# Patient Record
Sex: Male | Born: 2000
Health system: Southern US, Community
[De-identification: ages and names within clinical notes are randomized; demographics above are authoritative.]

---
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
## 2019-09-25 ENCOUNTER — Emergency Department (HOSPITAL_BASED_OUTPATIENT_CLINIC_OR_DEPARTMENT_OTHER): Payer: Self-pay

## 2019-09-25 ENCOUNTER — Emergency Department (HOSPITAL_BASED_OUTPATIENT_CLINIC_OR_DEPARTMENT_OTHER)
Admission: EM | Admit: 2019-09-25 | Discharge: 2019-09-25 | Disposition: A | Discharge status: Home / Self Care | Payer: Self-pay | Attending: Emergency Medicine | Admitting: Emergency Medicine

## 2019-09-25 ENCOUNTER — Other Ambulatory Visit: Payer: Self-pay

## 2019-09-25 ENCOUNTER — Encounter (HOSPITAL_BASED_OUTPATIENT_CLINIC_OR_DEPARTMENT_OTHER): Payer: Self-pay | Admitting: *Deleted

## 2019-09-25 DIAGNOSIS — Y9375 Activity, martial arts: Secondary | ICD-10-CM | POA: Insufficient documentation

## 2019-09-25 DIAGNOSIS — S9032XA Contusion of left foot, initial encounter: Secondary | ICD-10-CM | POA: Insufficient documentation

## 2019-09-25 DIAGNOSIS — T1490XA Injury, unspecified, initial encounter: Secondary | ICD-10-CM

## 2019-09-25 DIAGNOSIS — Y929 Unspecified place or not applicable: Secondary | ICD-10-CM | POA: Insufficient documentation

## 2019-09-25 DIAGNOSIS — Y999 Unspecified external cause status: Secondary | ICD-10-CM | POA: Insufficient documentation

## 2019-09-25 DIAGNOSIS — X509XXA Other and unspecified overexertion or strenuous movements or postures, initial encounter: Secondary | ICD-10-CM | POA: Insufficient documentation

## 2019-09-25 MED ORDER — IBUPROFEN 800 MG PO TABS
800.0000 mg | ORAL_TABLET | Freq: Once | ORAL | Status: AC
  Administered 2019-09-25: 800 mg via ORAL
  Filled 2019-09-25: qty 1

## 2019-09-25 NOTE — ED Provider Notes (Signed)
[facility] Provider Note   CSN: [id number] Arrival date & time: 09/25/19  0003     History   Chief Complaint Chief Complaint  Patient presents with  . Foot Injury    HPI Alan Logan is a 18 y.o. male.     HPI  This is a 18 year old male who presents with left foot pain.  Patient reports that he was kickboxing yesterday when he came down on his left foot.  Since that time he has had pain that is worse with ambulation.  Currently he rates his pain at 2 out of 10 but it increases with ambulation.  It is mostly just proximal to his great toe.  He denies any other injury.  He denies ankle pain or knee pain.  History reviewed. No pertinent past medical history.  There are no active problems to display for this patient.   History reviewed. No pertinent surgical history.      Home Medications    Prior to Admission medications   Not on File    Family History No family history on file.  Social History Social History   Tobacco Use  . Smoking status: Never Smoker  . Smokeless tobacco: Never Used  Substance Use Topics  . Alcohol use: Never    Frequency: Never  . Drug use: Never     Allergies   Patient has no known allergies.   Review of Systems Review of Systems  Musculoskeletal:       Foot pain  Neurological: Negative for weakness and numbness.  All other systems reviewed and are negative.    Physical Exam Updated Vital Signs BP (!) 142/71 (BP Location: Left Arm)   Pulse 60   Temp 98.7 F (37.1 C) (Oral)   Resp 18   Ht 1.803 m (5\' 11" )   Wt 72.6 kg   SpO2 100%   BMI 22.32 kg/m   Physical Exam Vitals signs and nursing note reviewed.  Constitutional:      Appearance: He is well-developed.  HENT:     Head: Normocephalic and atraumatic.     Mouth/Throat:     Mouth: Mucous membranes are moist.  Cardiovascular:     Rate and Rhythm: Normal rate and regular rhythm.  Pulmonary:     Effort: Pulmonary effort is  normal. No respiratory distress.  Musculoskeletal:     Comments: Normal range of motion of the ankle and toes of the left foot, no overlying skin changes, contusions, no obvious deformity, tenderness to palpation just proximal to the first MTP, no significant midfoot tenderness, 2+ DP pulse  Skin:    General: Skin is warm and dry.  Neurological:     Mental Status: He is alert and oriented to person, place, and time.  Psychiatric:        Mood and Affect: Mood normal.      ED Treatments / Results  Labs (all labs ordered are listed, but only abnormal results are displayed) Labs Reviewed - No data to display  EKG None  Radiology Dg Foot 2 Views Left  Result Date: 09/25/2019 CLINICAL DATA:  Left foot injury EXAM: LEFT FOOT - 2 VIEW COMPARISON:  None. FINDINGS: There is no evidence of fracture or dislocation. There is no evidence of arthropathy or other focal bone abnormality. Soft tissues are unremarkable. IMPRESSION: Negative. Electronically Signed   By: Rolm Baptise M.D.   On: 09/25/2019 03:25   Dg Foot Complete Left  Result Date: 09/25/2019 CLINICAL DATA:  Left foot pain after injury during kick boxing yesterday. Pain about the first and second metatarsals. EXAM: LEFT FOOT - COMPLETE 3+ VIEW COMPARISON:  None. FINDINGS: There is no evidence of fracture or dislocation. There is no evidence of arthropathy or other focal bone abnormality. Soft tissues are unremarkable. IMPRESSION: Negative radiographs of the left foot. Should symptoms persist, consider follow-up weight-bearing views. Electronically Signed   By: Narda Rutherford M.D.   On: 09/25/2019 00:48    Procedures Procedures (including critical care time)  Medications Ordered in ED Medications  ibuprofen (ADVIL) tablet 800 mg (800 mg Oral Given 09/25/19 0229)     Initial Impression / Assessment and Plan / ED Course  I have reviewed the triage vital signs and the nursing notes.  Pertinent labs & imaging results that were  available during my care of the patient were reviewed by me and considered in my medical decision making (see chart for details).        Patient presents with left foot pain.  Overall nontoxic and vital signs are reassuring.  He has some tenderness to palpation but no obvious deformity on exam.  Neurovascularly intact.  Initial x-rays negative.  Additional weightbearing x-rays are negative for any Lisfranc injury or other injury.  Recommend ice, rest, anti-inflammatories.  Suspect contusion.  After history, exam, and medical workup I feel the patient has been appropriately medically screened and is safe for discharge home. Pertinent diagnoses were discussed with the patient. Patient was given return precautions.   Final Clinical Impressions(s) / ED Diagnoses   Final diagnoses:  Contusion of left foot, initial encounter    ED Discharge Orders    None       Horton, Mayer Masker, MD 09/25/19 [phone]

## 2019-09-25 NOTE — ED Triage Notes (Signed)
Pt reports he injured his left foot in a kick boxing event yesterday. Reports he came down on it wrong. Ambulated to triage with limp

## 2019-09-25 NOTE — Discharge Instructions (Addendum)
You were seen today for left foot pain.  You likely sustained some bruising.  Your x-rays are negative.  Keep iced and elevated.  Take ibuprofen as needed for pain.

## 2021-05-22 IMAGING — DX DG FOOT 2V*L*
4 series · 4 of 4 positions shown · non-contrast
Comparison: None.

CLINICAL DATA: Left foot injury

EXAM:
LEFT FOOT - 2 VIEW

[foot ap (1 of 2)]
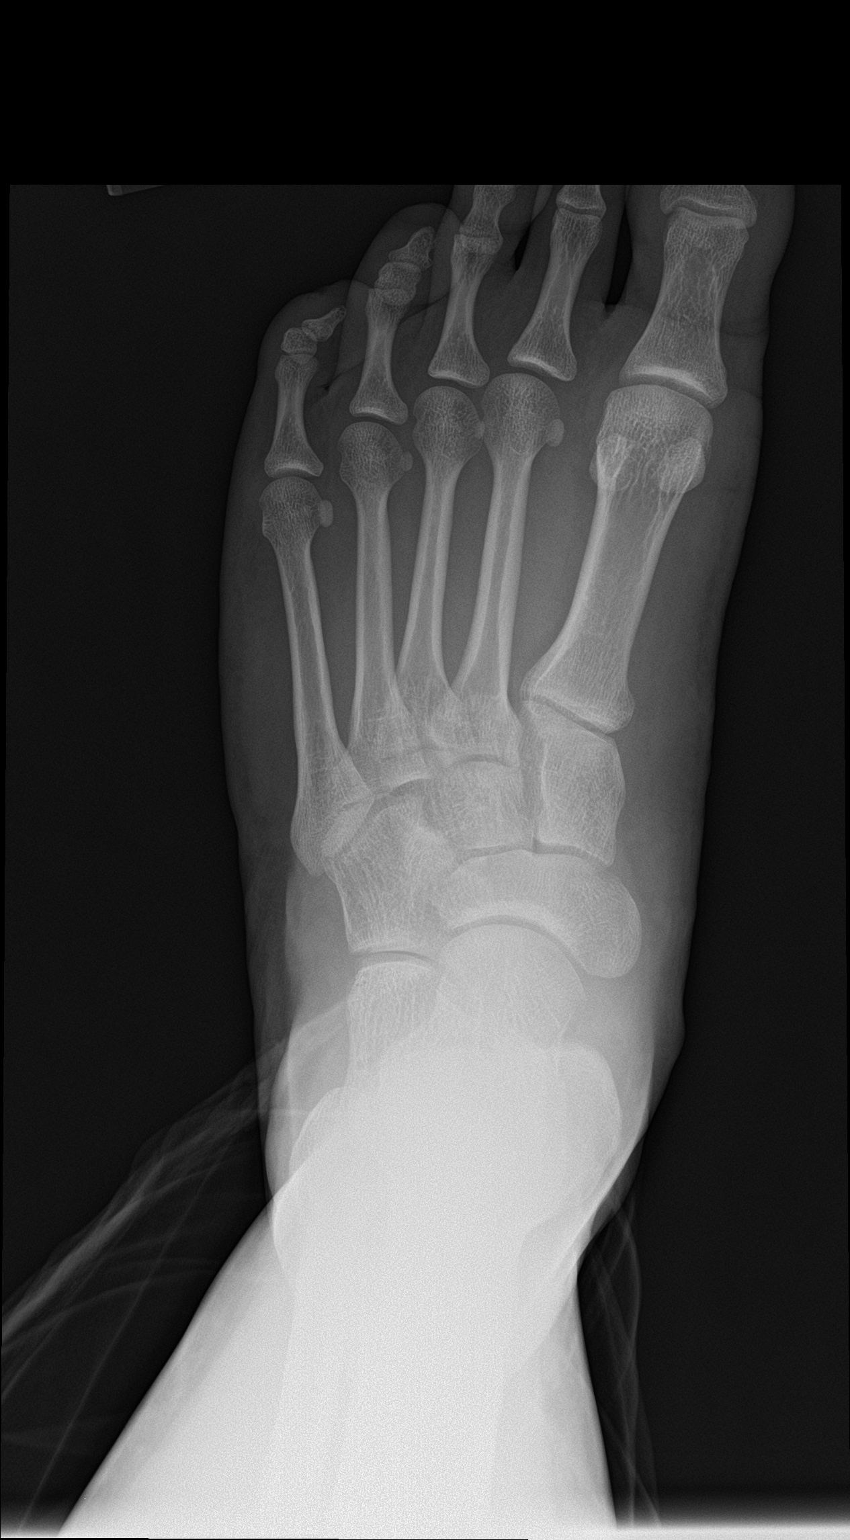

[foot ap (2 of 2)]
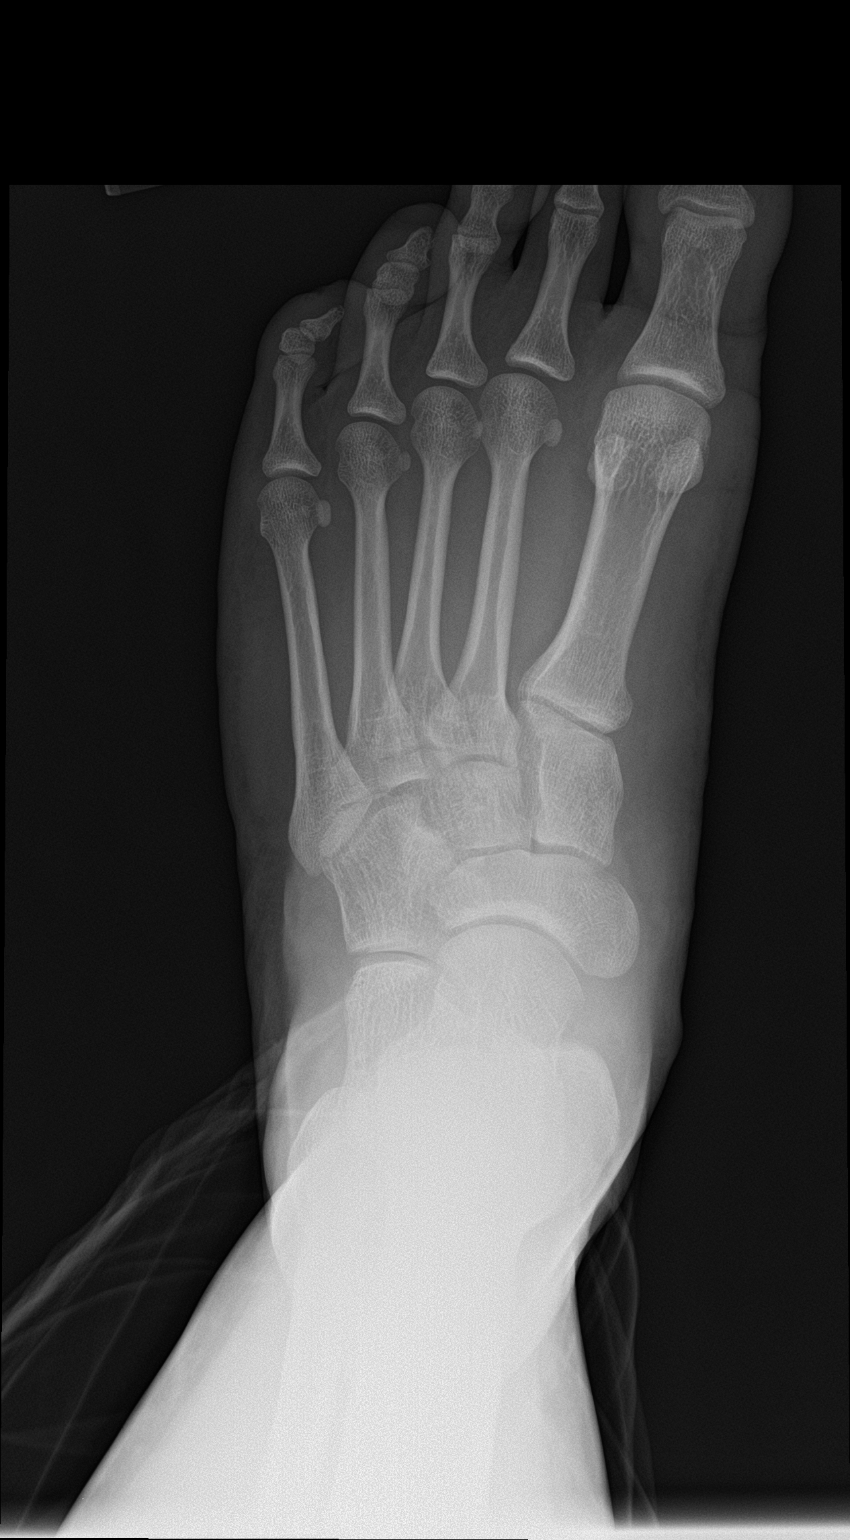

[foot lat (1 of 2)]
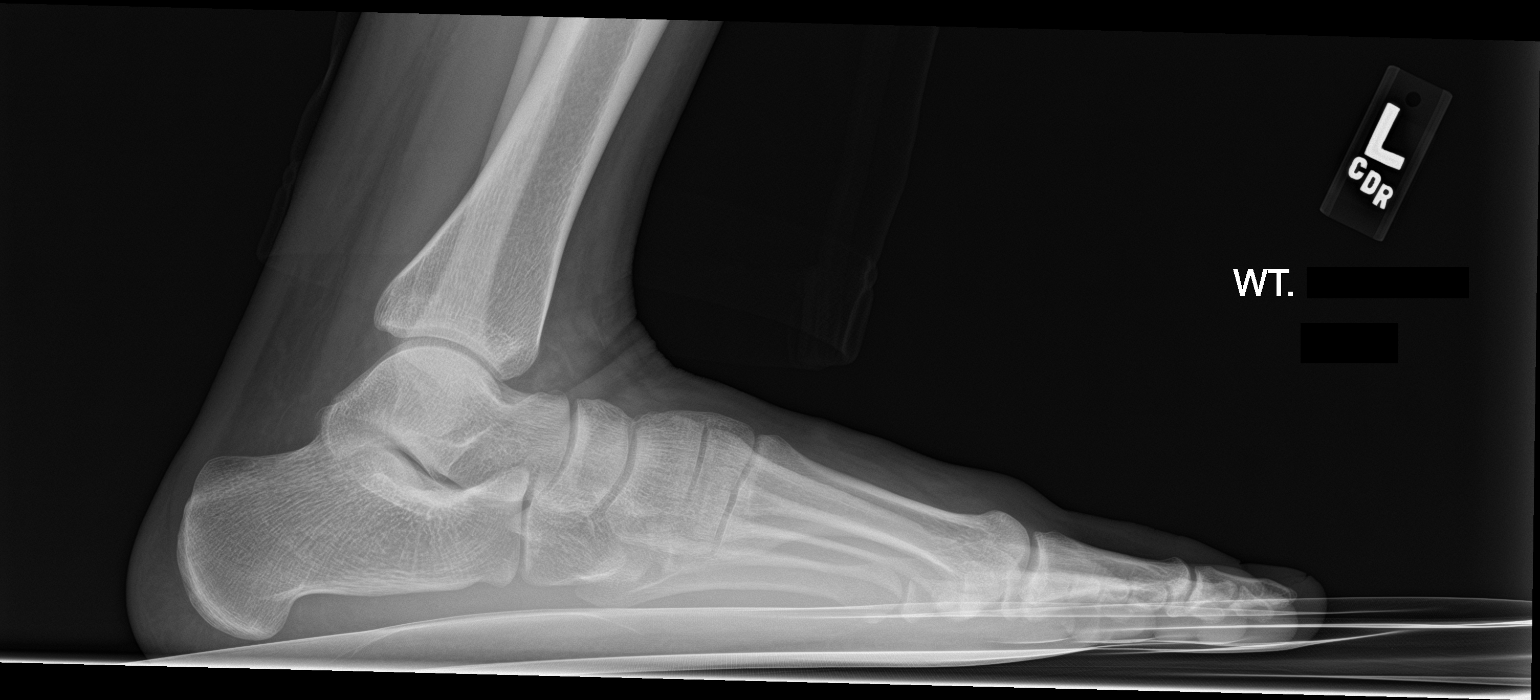

[foot lat (2 of 2)]
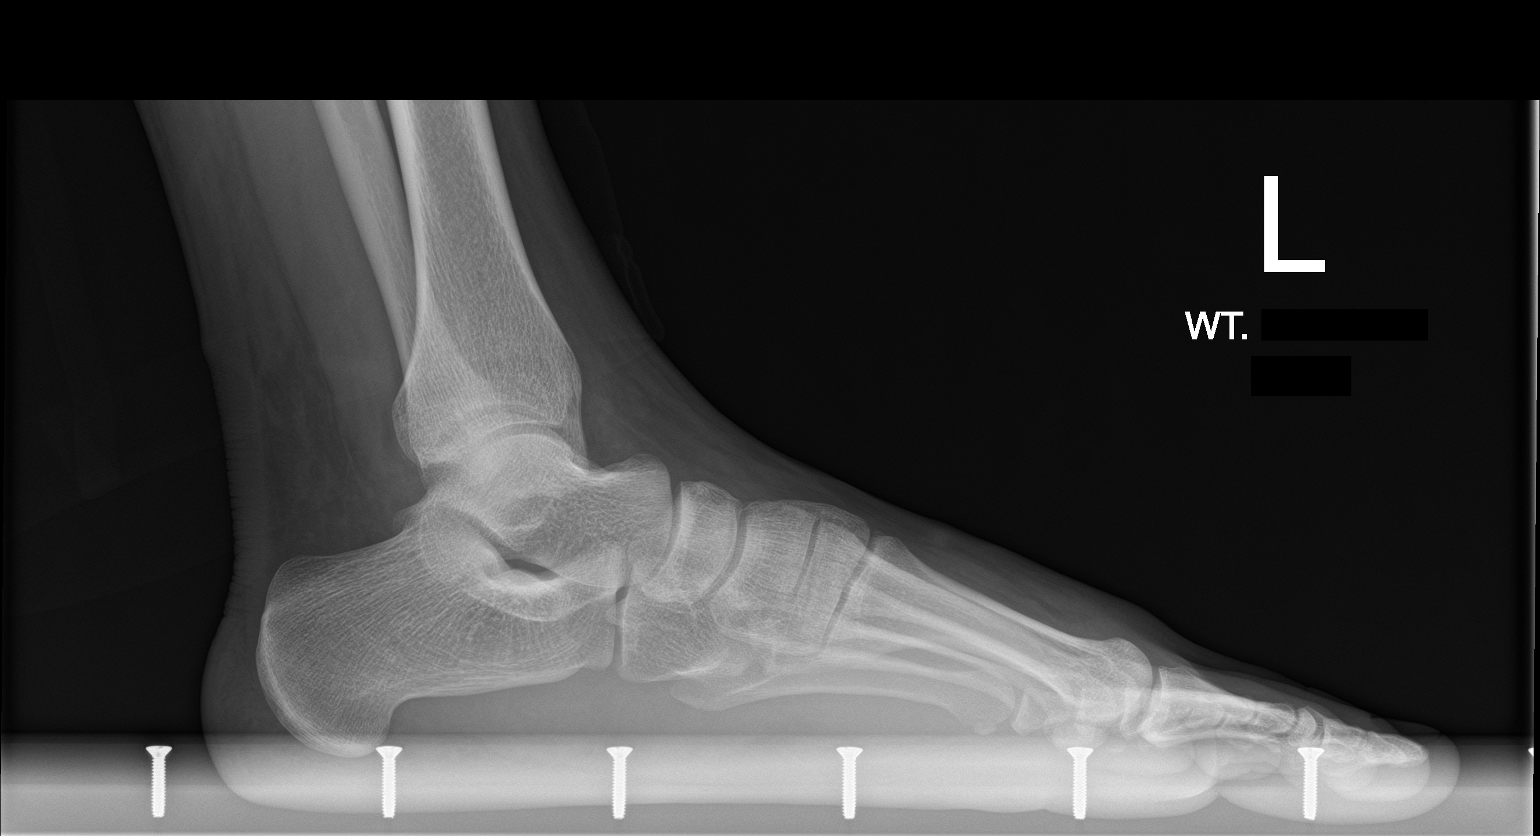

[4 of 4 positions shown; findings below may reference images not displayed]

FINDINGS: There is no evidence of fracture or dislocation. There is no
evidence of arthropathy or other focal bone abnormality. Soft
tissues are unremarkable.
IMPRESSION: Negative.

## 2024-04-27 ENCOUNTER — Encounter: Payer: Self-pay | Admitting: Urgent Care

## 2024-04-27 ENCOUNTER — Other Ambulatory Visit: Payer: Self-pay | Admitting: Urgent Care

## 2024-04-27 ENCOUNTER — Ambulatory Visit: Admitting: Urgent Care

## 2024-04-27 VITALS — BP 133/82 | HR 84 | Resp 18 | Ht 69.0 in | Wt 200.2 lb

## 2024-04-27 DIAGNOSIS — Z114 Encounter for screening for human immunodeficiency virus [HIV]: Secondary | ICD-10-CM | POA: Diagnosis not present

## 2024-04-27 DIAGNOSIS — F988 Other specified behavioral and emotional disorders with onset usually occurring in childhood and adolescence: Secondary | ICD-10-CM

## 2024-04-27 DIAGNOSIS — Z23 Encounter for immunization: Secondary | ICD-10-CM | POA: Diagnosis not present

## 2024-04-27 DIAGNOSIS — Z1159 Encounter for screening for other viral diseases: Secondary | ICD-10-CM

## 2024-04-27 DIAGNOSIS — Z0000 Encounter for general adult medical examination without abnormal findings: Secondary | ICD-10-CM

## 2024-04-27 MED ORDER — AMPHETAMINE-DEXTROAMPHET ER 10 MG PO CP24: 10.0000 mg | ORAL_CAPSULE | Freq: Every day | ORAL | 0 refills | Status: DC

## 2024-04-27 NOTE — Patient Instructions (Signed)
 We completed your annual physical today. We updated your HPV and tetanus vaccines.  Please return in 2 months for the second HPV vaccine and 6 months for the last one  Please start Adderall XR 10mg  every morning. If taken too late in the day, it can cause issues sleeping at night so please do not take after 10am.  Return in one month for follow up.

## 2024-04-27 NOTE — Progress Notes (Signed)
 Annual Wellness Visit     Patient: Alan Logan, Male    DOB: Dec 27, 2000, 23 y.o.   MRN: [id number]  Subjective  Chief Complaint  Patient presents with   New Patient (Initial Visit)    Est. Care    Alan Logan is a 23 y.o. male who presents today for his Annual Wellness Visit. He reports consuming a general diet. Gym/ health club routine includes cardio and mod to heavy weightlifting. He generally feels well. He reports sleeping well. He does not have additional problems to discuss today.   HPI  Vision:Not within last year  and Dental: No regular dental care   Discussed the use of AI scribe software for clinical note transcription with the patient, who gave verbal consent to proceed.  History of Present Illness   Alan Logan is a 23 year old male who presents for an annual physical exam.  He experiences heat intolerance and generalized sweating, stating that he 'gets hot real easy' and is 'constantly sweating all the time,' regardless of being indoors or outdoors. No drenching night sweats that require changing sheets.  He has concerns about memory and concentration, noting occasional forgetfulness, such as not remembering tasks his mother asks him to do or tasks at work. He describes this as noticeable but not severe.  He engages in moderate physical activity, working out in a gym environment with a mix of cardio and weightlifting approximately every five days, equating to about two days a week. He reports sleeping well without issues of tossing and turning or waking up multiple times a night.  He denies any dietary restrictions or specific foods he avoids. He has not had routine dental or eye exams recently.  He denies any falls in the past year, except for minor trips, and reports no issues with daily activities such as cooking, cleaning, or driving.  He has no current alcohol intake, having stopped drinking, and has no history of tobacco use. 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       There are no active problems to display for this patient.  History reviewed. No pertinent past medical history. History reviewed. No pertinent surgical history. Social History   Tobacco Use   Smoking status: Never   Smokeless tobacco: Never  Vaping Use   Vaping status: Never Used  Substance Use Topics   Alcohol use: Never   Drug use: Never      Medications: No outpatient medications prior to visit.   No facility-administered medications prior to visit.    No Known Allergies  Patient Care Team: Mandy Second, [place] as PCP - General (Physician Assistant)  ROS Complete 12 point ROS performed with all pertinent positives listed in HPI      Objective  BP 133/82 (BP Location: Left Arm, Patient Position: Sitting, Cuff Size: Normal)   Pulse 84   Resp 18   Ht 5' 9 (1.753 m)   Wt 200 lb 4 oz (90.8 kg)   SpO2 99%   BMI 29.57 kg/m  BP Readings from Last 3 Encounters:  04/27/24 133/82  09/25/19 112/78   Wt Readings from Last 3 Encounters:  04/27/24 200 lb 4 oz (90.8 kg)  09/25/19 160 lb (72.6 kg)      Physical Exam Vitals and nursing note reviewed.  Constitutional:      General: He is not in acute distress.  Appearance: Normal appearance. He is not ill-appearing, toxic-appearing or diaphoretic.  HENT:     Head: Normocephalic and atraumatic.     Right Ear: Tympanic membrane, ear canal and external ear normal. There is no impacted cerumen.     Left Ear: Tympanic membrane, ear canal and external ear normal. There is no impacted cerumen.     Nose: Nose normal.     Mouth/Throat:     Mouth: Mucous membranes are moist.     Pharynx: Oropharynx is clear. No oropharyngeal exudate or posterior oropharyngeal erythema.   Eyes:     General: No scleral icterus.       Right eye: No discharge.        Left eye: No discharge.     Extraocular Movements: Extraocular  movements intact.     Pupils: Pupils are equal, round, and reactive to light.   Neck:     Thyroid: No thyroid mass, thyromegaly or thyroid tenderness.   Cardiovascular:     Rate and Rhythm: Normal rate and regular rhythm.     Pulses: Normal pulses.     Heart sounds: No murmur heard. Pulmonary:     Effort: Pulmonary effort is normal. No respiratory distress.     Breath sounds: Normal breath sounds. No stridor. No wheezing or rhonchi.  Abdominal:     General: Abdomen is flat. Bowel sounds are normal. There is no distension.     Palpations: Abdomen is soft. There is no mass.     Tenderness: There is no abdominal tenderness. There is no guarding.   Musculoskeletal:     Cervical back: Normal range of motion and neck supple. No rigidity or tenderness.     Right lower leg: No edema.     Left lower leg: No edema.  Lymphadenopathy:     Cervical: No cervical adenopathy.   Skin:    General: Skin is warm and dry.     Coloration: Skin is not jaundiced.     Findings: No bruising, erythema or rash.   Neurological:     General: No focal deficit present.     Mental Status: He is alert and oriented to person, place, and time.     Sensory: No sensory deficit.     Motor: No weakness.   Psychiatric:        Mood and Affect: Mood normal.        Behavior: Behavior normal.       Most recent functional status assessment:    No concerns identified         Most recent fall risk assessment:    No falls within past year          Most recent depression screenings:     No data to display         Most recent cognitive screening:    Does report forgetfullness at times with job duties, but no major life impact         Most recent Audit-C alcohol use screening    None currently         A score of 3 or more in women, and 4 or more in men indicates increased risk for alcohol abuse, EXCEPT if all of the points are from question 1   Vision/Hearing Screen: No results  found.    No results found for any visits on 04/27/24.    Assessment & Plan   Annual wellness visit done today including the all of the following: Reviewed patient's Family Medical  History Reviewed and updated list of patient's medical providers Assessment of cognitive impairment was done Assessed patient's functional ability Established a written schedule for health screening services Health Risk Assessent Completed and Reviewed  Exercise Activities and Dietary recommendations  Goals   None     Immunization History  Administered Date(s) Administered   HPV 9-valent 04/27/2024   Influenza Whole 01/06/2010   Janssen (J&J) SARS-COV-2 Vaccination 05/24/2020   PPD Test 04/16/2023, 04/26/2023, 04/11/2024   Tdap 04/19/2023, 04/27/2024   Varicella 09/11/2021, 09/16/2021    Health Maintenance  Topic Date Due   HIV Screening  Never done   Hepatitis C Screening  Never done   COVID-19 Vaccine (2 - 2024-25 season) 07/11/2023   HPV VACCINES (2 - Male 3-dose series) 05/25/2024   INFLUENZA VACCINE  06/09/2024   DTaP/Tdap/Td (3 - Td or Tdap) 04/27/2034   Meningococcal B Vaccine  Aged Out     Discussed health benefits of physical activity, and encouraged him to engage in regular exercise appropriate for his age and condition.    Problem List Items Addressed This Visit   None Visit Diagnoses       Encounter for routine adult health examination without abnormal findings    -  Primary   Relevant Orders   CMP14+EGFR   CBC w/Diff/Platelet   TSH   Lipid panel     Encounter for screening for HIV       Relevant Orders   HIV Antibody (routine testing w rflx)     Need for hepatitis C screening test       Relevant Orders   Hepatitis C Antibody     Need for HPV vaccine       Relevant Orders   HPV 9-valent vaccine,Recombinat (Completed)     Need for vaccine for Td (tetanus-diphtheria)       Relevant Orders   Tdap vaccine greater than or equal to 7yo IM (Completed)     Attention  deficit disorder (ADD) without hyperactivity       Relevant Medications   amphetamine-dextroamphetamine (ADDERALL XR) 10 MG 24 hr capsule      Assessment and Plan    Heat intolerance Experiences heat intolerance and generalized sweating. Differential includes thyroid dysfunction and systemic causes. - Order CBC and thyroid function tests.  Cognitive concerns Reports occasional forgetfulness and concentration issues. ADD screening questionnaire performed confirming dx. - Start Adderall 10mg  XR daily - cut out high fructose corn syrup  General Health Maintenance Due for tetanus and HPV vaccinations. Discussed importance of vaccines and routine screenings for hepatitis C and HIV. - Administered Tdap and HPV vaccines if due. - return in 2 months for 2nd HPV - Conduct routine screenings for hepatitis C and HIV. - Encourage MyChart sign-up for health records access.        Return in about 4 weeks (around 05/25/2024).     Mandy Second, PA

## 2024-04-27 NOTE — Telephone Encounter (Unsigned)
 Copied from CRM [id number]. Topic: Clinical - Prescription Issue >> Apr 27, 2024  4:38 PM [employer] wrote: Reason for CRM: Brad calling from [facility] stating that they do not have amphetamine-dextroamphetamine (ADDERALL XR) 10 MG 24 hr capsule and they contacted the patient to inform him and he requested for it to be sent to [facility] Cherylyn Cos, [place] - [address] AT [facility] [address] [place] [id number] Phone: [phone] Fax: [phone]

## 2024-04-28 ENCOUNTER — Ambulatory Visit: Payer: Self-pay | Admitting: Urgent Care

## 2024-04-28 ENCOUNTER — Encounter: Payer: Self-pay | Admitting: Urgent Care

## 2024-04-28 ENCOUNTER — Other Ambulatory Visit (HOSPITAL_BASED_OUTPATIENT_CLINIC_OR_DEPARTMENT_OTHER): Payer: Self-pay

## 2024-04-28 DIAGNOSIS — F988 Other specified behavioral and emotional disorders with onset usually occurring in childhood and adolescence: Secondary | ICD-10-CM

## 2024-04-28 LAB — CBC WITH DIFFERENTIAL/PLATELET
Basophils Absolute: 0 10*3/uL (ref 0.0–0.2)
Basos: 1 %
EOS (ABSOLUTE): 0.1 10*3/uL (ref 0.0–0.4)
Eos: 2 %
Hematocrit: 49 % (ref 37.5–51.0)
Hemoglobin: 15.4 g/dL (ref 13.0–17.7)
Immature Grans (Abs): 0 10*3/uL (ref 0.0–0.1)
Immature Granulocytes: 0 %
Lymphocytes Absolute: 1.5 10*3/uL (ref 0.7–3.1)
Lymphs: 25 %
MCH: 25.9 pg — ABNORMAL LOW (ref 26.6–33.0)
MCHC: 31.4 g/dL — ABNORMAL LOW (ref 31.5–35.7)
MCV: 82 fL (ref 79–97)
Monocytes Absolute: 0.4 10*3/uL (ref 0.1–0.9)
Monocytes: 7 %
Neutrophils Absolute: 3.8 10*3/uL (ref 1.4–7.0)
Neutrophils: 65 %
Platelets: 280 10*3/uL (ref 150–450)
RBC: 5.95 x10E6/uL — ABNORMAL HIGH (ref 4.14–5.80)
RDW: 14.4 % (ref 11.6–15.4)
WBC: 5.8 10*3/uL (ref 3.4–10.8)

## 2024-04-28 LAB — LIPID PANEL
Chol/HDL Ratio: 4.1 ratio (ref 0.0–5.0)
Cholesterol, Total: 215 mg/dL — ABNORMAL HIGH (ref 100–199)
HDL: 53 mg/dL (ref >39)
LDL Chol Calc (NIH): 144 mg/dL — ABNORMAL HIGH (ref 0–99)
Triglycerides: 102 mg/dL (ref 0–149)
VLDL Cholesterol Cal: 18 mg/dL (ref 5–40)

## 2024-04-28 LAB — CMP14+EGFR
ALT: 60 IU/L — ABNORMAL HIGH (ref 0–44)
AST: 31 IU/L (ref 0–40)
Albumin: 4.4 g/dL (ref 4.3–5.2)
Alkaline Phosphatase: 89 IU/L (ref 44–121)
BUN/Creatinine Ratio: 10 (ref 9–20)
BUN: 12 mg/dL (ref 6–20)
Bilirubin Total: 0.4 mg/dL (ref 0.0–1.2)
CO2: 24 mmol/L (ref 20–29)
Calcium: 10 mg/dL (ref 8.7–10.2)
Chloride: 102 mmol/L (ref 96–106)
Creatinine, Ser: 1.21 mg/dL (ref 0.76–1.27)
Globulin, Total: 2.8 g/dL (ref 1.5–4.5)
Glucose: 93 mg/dL (ref 70–99)
Potassium: 4.3 mmol/L (ref 3.5–5.2)
Sodium: 139 mmol/L (ref 134–144)
Total Protein: 7.2 g/dL (ref 6.0–8.5)
eGFR: 86 mL/min/{1.73_m2} (ref >59)

## 2024-04-28 LAB — TSH: TSH: 0.898 u[IU]/mL (ref 0.450–4.500)

## 2024-04-28 LAB — HEPATITIS C ANTIBODY: Hep C Virus Ab: NONREACTIVE

## 2024-04-28 LAB — HIV ANTIBODY (ROUTINE TESTING W REFLEX): HIV Screen 4th Generation wRfx: NONREACTIVE

## 2024-04-28 MED ORDER — AMPHETAMINE-DEXTROAMPHET ER 10 MG PO CP24: 10.0000 mg | ORAL_CAPSULE | Freq: Every day | ORAL | 0 refills | Status: AC

## 2024-04-28 NOTE — Telephone Encounter (Signed)
 This has been taken care of. New script sent. Thanks

## 2024-04-28 NOTE — Telephone Encounter (Signed)
 No further action needed at this time.

## 2024-05-25 ENCOUNTER — Ambulatory Visit: Admitting: Urgent Care
